# Patient Record
Sex: Female | Born: 1975 | Race: White | Hispanic: No | Smoking: Current every day smoker
Health system: Southern US, Community
[De-identification: ages and names within clinical notes are randomized; demographics above are authoritative.]

## PROBLEM LIST (undated history)

## (undated) DIAGNOSIS — K5792 Diverticulitis of intestine, part unspecified, without perforation or abscess without bleeding: Secondary | ICD-10-CM

## (undated) DIAGNOSIS — K429 Umbilical hernia without obstruction or gangrene: Secondary | ICD-10-CM

## (undated) DIAGNOSIS — I82409 Acute embolism and thrombosis of unspecified deep veins of unspecified lower extremity: Secondary | ICD-10-CM

## (undated) HISTORY — PX: TUBAL LIGATION: SHX77

## (undated) HISTORY — PX: UMBILICAL HERNIA REPAIR: SHX196

---

## 2016-04-23 ENCOUNTER — Emergency Department (HOSPITAL_COMMUNITY)
Admission: EM | Admit: 2016-04-23 | Discharge: 2016-04-24 | Disposition: A | Discharge status: Home / Self Care | Payer: Medicare Other | Attending: Emergency Medicine | Admitting: Emergency Medicine

## 2016-04-23 ENCOUNTER — Encounter (HOSPITAL_COMMUNITY): Payer: Self-pay | Admitting: *Deleted

## 2016-04-23 DIAGNOSIS — K529 Noninfective gastroenteritis and colitis, unspecified: Secondary | ICD-10-CM | POA: Diagnosis not present

## 2016-04-23 DIAGNOSIS — Z7951 Long term (current) use of inhaled steroids: Secondary | ICD-10-CM | POA: Diagnosis not present

## 2016-04-23 DIAGNOSIS — F17200 Nicotine dependence, unspecified, uncomplicated: Secondary | ICD-10-CM | POA: Diagnosis not present

## 2016-04-23 DIAGNOSIS — R109 Unspecified abdominal pain: Secondary | ICD-10-CM | POA: Diagnosis present

## 2016-04-23 HISTORY — DX: Diverticulitis of intestine, part unspecified, without perforation or abscess without bleeding: K57.92

## 2016-04-23 HISTORY — DX: Umbilical hernia without obstruction or gangrene: K42.9

## 2016-04-23 HISTORY — DX: Acute embolism and thrombosis of unspecified deep veins of unspecified lower extremity: I82.409

## 2016-04-23 LAB — URINALYSIS, ROUTINE W REFLEX MICROSCOPIC
Glucose, UA: NEGATIVE mg/dL
HGB URINE DIPSTICK: NEGATIVE
Ketones, ur: NEGATIVE mg/dL
Nitrite: NEGATIVE
PH: 6 (ref 5.0–8.0)
Protein, ur: NEGATIVE mg/dL
SPECIFIC GRAVITY, URINE: 1.022 (ref 1.005–1.030)

## 2016-04-23 LAB — LIPASE, BLOOD: Lipase: 22 U/L (ref 11–51)

## 2016-04-23 LAB — URINE MICROSCOPIC-ADD ON: RBC / HPF: NONE SEEN RBC/hpf (ref 0–5)

## 2016-04-23 LAB — COMPREHENSIVE METABOLIC PANEL
ALBUMIN: 3.8 g/dL (ref 3.5–5.0)
ALK PHOS: 86 U/L (ref 38–126)
ALT: 22 U/L (ref 14–54)
ANION GAP: 7 (ref 5–15)
AST: 22 U/L (ref 15–41)
BUN: 11 mg/dL (ref 6–20)
CHLORIDE: 101 mmol/L (ref 101–111)
CO2: 27 mmol/L (ref 22–32)
Calcium: 9.4 mg/dL (ref 8.9–10.3)
Creatinine, Ser: 0.82 mg/dL (ref 0.44–1.00)
GFR calc Af Amer: >60 mL/min (ref >60)
GFR calc non Af Amer: >60 mL/min (ref >60)
GLUCOSE: 98 mg/dL (ref 65–99)
POTASSIUM: 3.2 mmol/L — AB (ref 3.5–5.1)
Sodium: 135 mmol/L (ref 135–145)
Total Protein: 7.6 g/dL (ref 6.5–8.1)

## 2016-04-23 LAB — CBC
HEMATOCRIT: 31.7 % — AB (ref 36.0–46.0)
HEMOGLOBIN: 9.5 g/dL — AB (ref 12.0–15.0)
MCH: 20.6 pg — AB (ref 26.0–34.0)
MCHC: 30 g/dL (ref 30.0–36.0)
MCV: 68.6 fL — ABNORMAL LOW (ref 78.0–100.0)
Platelets: 414 10*3/uL — ABNORMAL HIGH (ref 150–400)
RBC: 4.62 MIL/uL (ref 3.87–5.11)
RDW: 18.6 % — ABNORMAL HIGH (ref 11.5–15.5)
WBC: 14.1 10*3/uL — ABNORMAL HIGH (ref 4.0–10.5)

## 2016-04-23 LAB — PREGNANCY, URINE: Preg Test, Ur: NEGATIVE

## 2016-04-23 MED ORDER — SODIUM CHLORIDE 0.9 % IV BOLUS (SEPSIS)
500.0000 mL | Freq: Once | INTRAVENOUS | Status: AC
  Administered 2016-04-23: 500 mL via INTRAVENOUS

## 2016-04-23 MED ORDER — HYDROMORPHONE HCL 1 MG/ML IJ SOLN
0.5000 mg | Freq: Once | INTRAMUSCULAR | Status: AC
  Administered 2016-04-23: 0.5 mg via INTRAVENOUS
  Filled 2016-04-23: qty 1

## 2016-04-23 NOTE — ED Notes (Signed)
Pt complains of mid abdominal pain, nausea, vomiting, dizziness, and diarrhea for the past 3 days. Pt states she has an umbilical hernia. Pt states she saw blood on tissue paper after a bowel movement today. Pt has hx of diverticulitis in November.

## 2016-04-24 ENCOUNTER — Emergency Department (HOSPITAL_COMMUNITY): Payer: Medicare Other

## 2016-04-24 ENCOUNTER — Encounter (HOSPITAL_COMMUNITY): Payer: Self-pay

## 2016-04-24 DIAGNOSIS — K529 Noninfective gastroenteritis and colitis, unspecified: Secondary | ICD-10-CM | POA: Diagnosis not present

## 2016-04-24 MED ORDER — OXYCODONE-ACETAMINOPHEN 5-325 MG PO TABS: 1.0000 | ORAL_TABLET | ORAL | Status: AC | PRN

## 2016-04-24 MED ORDER — METRONIDAZOLE 500 MG PO TABS
500.0000 mg | ORAL_TABLET | Freq: Three times a day (TID) | ORAL | Status: AC
Start: 2016-04-24 — End: ?

## 2016-04-24 MED ORDER — HYDROMORPHONE HCL 1 MG/ML IJ SOLN
1.0000 mg | Freq: Once | INTRAMUSCULAR | Status: AC
  Administered 2016-04-24: 1 mg via INTRAVENOUS
  Filled 2016-04-24: qty 1

## 2016-04-24 MED ORDER — METRONIDAZOLE 500 MG PO TABS
500.0000 mg | ORAL_TABLET | Freq: Once | ORAL | Status: AC
  Administered 2016-04-24: 500 mg via ORAL
  Filled 2016-04-24: qty 1

## 2016-04-24 MED ORDER — IOPAMIDOL (ISOVUE-300) INJECTION 61%
100.0000 mL | Freq: Once | INTRAVENOUS | Status: AC | PRN
  Administered 2016-04-24: 100 mL via INTRAVENOUS

## 2016-04-24 MED ORDER — PROMETHAZINE HCL 25 MG PO TABS: 25.0000 mg | ORAL_TABLET | Freq: Four times a day (QID) | ORAL | Status: AC | PRN

## 2016-04-24 MED ORDER — CIPROFLOXACIN HCL 500 MG PO TABS: 500.0000 mg | ORAL_TABLET | Freq: Two times a day (BID) | ORAL | Status: AC

## 2016-04-24 MED ORDER — CIPROFLOXACIN HCL 500 MG PO TABS
500.0000 mg | ORAL_TABLET | Freq: Once | ORAL | Status: AC
  Administered 2016-04-24: 500 mg via ORAL
  Filled 2016-04-24: qty 1

## 2016-04-24 NOTE — Discharge Instructions (Signed)
Return for return of fever, inability to eat and drink, inability to take your antibiotics, worsening or uncontrolled pain  Colitis Colitis is inflammation of the colon. Colitis may last a short time (acute) or it may last a long time (chronic). CAUSES This condition may be caused by:  Viruses.  Bacteria.  Reactions to medicine.  Certain autoimmune diseases, such as Crohn disease or ulcerative colitis. SYMPTOMS Symptoms of this condition include:  Diarrhea.  Passing bloody or tarry stool.  Pain.  Fever.  Vomiting.  Tiredness (fatigue).  Weight loss.  Bloating.  Sudden increase in abdominal pain.  Having fewer bowel movements than usual. DIAGNOSIS This condition is diagnosed with a stool test or a blood test. You may also have other tests, including X-rays, a CT scan, or a colonoscopy. TREATMENT Treatment may include:  Resting the bowel. This involves not eating or drinking for a period of time.  Fluids that are given through an IV tube.  Medicine for pain and diarrhea.  Antibiotic medicines.  Cortisone medicines.  Surgery. HOME CARE INSTRUCTIONS Eating and Drinking  Follow instructions from your health care provider about eating or drinking restrictions.  Drink enough fluid to keep your urine clear or pale yellow.  Work with a dietitian to determine which foods cause your condition to flare up.  Avoid foods that cause flare-ups.  Eat a well-balanced diet. Medicines  Take over-the-counter and prescription medicines only as told by your health care provider.  If you were prescribed an antibiotic medicine, take it as told by your health care provider. Do not stop taking the antibiotic even if you start to feel better. General Instructions  Keep all follow-up visits as told by your health care provider. This is important. SEEK MEDICAL CARE IF:  Your symptoms do not go away.  You develop new symptoms. SEEK IMMEDIATE MEDICAL CARE IF:  You have  a fever that does not go away with treatment.  You develop chills.  You have extreme weakness, fainting, or dehydration.  You have repeated vomiting.  You develop severe pain in your abdomen.  You pass bloody or tarry stool.   This information is not intended to replace advice given to you by your health care provider. Make sure you discuss any questions you have with your health care provider.   Document Released: 11/30/2004 Document Revised: 07/14/2015 Document Reviewed: 02/15/2015 Elsevier Interactive Patient Education [employer]2016 [employer] Inc.

## 2016-04-24 NOTE — ED Provider Notes (Signed)
CSN: [id number]     Arrival date & time 04/23/16  2027 History   First MD Initiated Contact with Patient 04/23/16 2230     Chief Complaint  Patient presents with  . Abdominal Pain  . Nausea  . Emesis  . Diarrhea     (Consider location/radiation/quality/duration/timing/severity/associated sxs/prior Treatment) HPI Comments: 40 year old female with history of diverticulitis, bowel obstruction, multiple abdominal surgeries presents for abdominal pain. The patient states that over the last 3 days she had a fever as well as nausea, vomiting, diarrhea. She reports that the fever broke and that the vomiting and diarrhea got better but that she has continued to have severe pain like somebody is taking a sharp knife and running in and out of her stomach all along the central part of her stomach.   Past Medical History  Diagnosis Date  . DVT (deep venous thrombosis) (HCC)   . Diverticulitis   . Umbilical hernia    Past Surgical History  Procedure Laterality Date  . Tubal ligation    . Umbilical hernia repair     No family history on file. Social History  Substance Use Topics  . Smoking status: Current Every Day Smoker  . Smokeless tobacco: None  . Alcohol Use: No   OB History    No data available     Review of Systems  Constitutional: Negative for fever, chills and fatigue.  HENT: Negative for congestion, postnasal drip, rhinorrhea and sinus pressure.   Eyes: Negative for visual disturbance.  Respiratory: Negative for cough, chest tightness and shortness of breath.   Cardiovascular: Negative for chest pain and palpitations.  Gastrointestinal: Positive for nausea, vomiting, abdominal pain and diarrhea.  Genitourinary: Negative for dysuria, urgency and hematuria.  Musculoskeletal: Negative for myalgias and back pain.  Skin: Negative for rash.  Neurological: Positive for light-headedness. Negative for dizziness, weakness and headaches.  Hematological: Does not bruise/bleed easily.       Allergies  Reglan  Home Medications   Prior to Admission medications   Medication Sig Start Date End Date Taking? Authorizing Provider  albuterol (PROVENTIL HFA;VENTOLIN HFA) 108 (90 Base) MCG/ACT inhaler Inhale 2 puffs into the lungs 2 (two) times daily. May use an additional 2 times daily as needed for shortness of breath   Yes Historical Provider, MD  albuterol (PROVENTIL) (2.5 MG/3ML) 0.083% nebulizer solution Take 2.5 mg by nebulization 4 (four) times daily.   Yes Historical Provider, MD  ciprofloxacin (CIPRO) 500 MG tablet Take 1 tablet (500 mg total) by mouth 2 (two) times daily. 04/24/16   Leta Baptist, MD  Fluticasone-Salmeterol (ADVAIR) 500-50 MCG/DOSE AEPB Inhale 1 puff into the lungs 2 (two) times daily.   Yes Historical Provider, MD  ipratropium-albuterol (DUONEB) 0.5-2.5 (3) MG/3ML SOLN Take 3 mLs by nebulization every 6 (six) hours as needed (shortness of breath).   Yes Historical Provider, MD  metroNIDAZOLE (FLAGYL) 500 MG tablet Take 1 tablet (500 mg total) by mouth 3 (three) times daily. 04/24/16   Leta Baptist, MD  omeprazole (PRILOSEC) 40 MG capsule Take 40 mg by mouth daily.   Yes Historical Provider, MD  oxyCODONE-acetaminophen (PERCOCET/ROXICET) 5-325 MG tablet Take 1-2 tablets by mouth every 4 (four) hours as needed for moderate pain. 04/24/16   Leta Baptist, MD  promethazine (PHENERGAN) 25 MG tablet Take 1 tablet (25 mg total) by mouth every 6 (six) hours as needed for nausea or vomiting. 04/24/16   Leta Baptist, MD   BP 123/78 mmHg  Pulse 89  Temp(Src) 98.1 F (36.7 C) (Oral)  Resp 14  Wt 250 lb (113.399 kg)  SpO2 96%  LMP 04/06/2016 Physical Exam  Constitutional: She is oriented to person, place, and time. She appears well-developed and well-nourished.  Appears uncomfortable  HENT:  Head: Normocephalic and atraumatic.  Right Ear: External ear normal.  Left Ear: External ear normal.  Nose: Nose normal.  Mouth/Throat: Oropharynx is clear  and moist. No oropharyngeal exudate.  Eyes: EOM are normal. Pupils are equal, round, and reactive to light.  Neck: Normal range of motion. Neck supple.  Cardiovascular: Normal rate, regular rhythm, normal heart sounds and intact distal pulses.   No murmur heard. Pulmonary/Chest: Effort normal. No respiratory distress. She has no wheezes. She has no rales.  Abdominal: Soft. She exhibits no distension. There is tenderness (diffuse but most significant over the central abdomen and periumbilical). There is no rebound and no guarding.  Musculoskeletal: Normal range of motion. She exhibits no edema or tenderness.  Neurological: She is alert and oriented to person, place, and time.  Skin: Skin is warm and dry. No rash noted. She is not diaphoretic.  Vitals reviewed.   ED Course  Procedures (including critical care time) Labs Review Labs Reviewed  COMPREHENSIVE METABOLIC PANEL - Abnormal; Notable for the following:    Potassium 3.2 (*)    Total Bilirubin <0.1 (*)    All other components within normal limits  CBC - Abnormal; Notable for the following:    WBC 14.1 (*)    Hemoglobin 9.5 (*)    HCT 31.7 (*)    MCV 68.6 (*)    MCH 20.6 (*)    RDW 18.6 (*)    Platelets 414 (*)    All other components within normal limits  URINALYSIS, ROUTINE W REFLEX MICROSCOPIC (NOT AT [facility]) - Abnormal; Notable for the following:    Color, Urine AMBER (*)    APPearance CLOUDY (*)    Bilirubin Urine SMALL (*)    Leukocytes, UA TRACE (*)    All other components within normal limits  URINE MICROSCOPIC-ADD ON - Abnormal; Notable for the following:    Squamous Epithelial / LPF 6-30 (*)    Bacteria, UA FEW (*)    Crystals CA OXALATE CRYSTALS (*)    All other components within normal limits  LIPASE, BLOOD  PREGNANCY, URINE    Imaging Review Ct Abdomen Pelvis W Contrast  04/24/2016  CLINICAL DATA:  Acute onset of left lower quadrant abdominal pain, nausea, vomiting and diarrhea. Leukocytosis. Initial  encounter. EXAM: CT ABDOMEN AND PELVIS WITH CONTRAST TECHNIQUE: Multidetector CT imaging of the abdomen and pelvis was performed using the standard protocol following bolus administration of intravenous contrast. CONTRAST:  ISOVUE-300 IOPAMIDOL (ISOVUE-300) INJECTION 61% COMPARISON:  None. FINDINGS: The visualized lung bases are clear.  A tiny hiatal hernia is noted. The liver is unremarkable in appearance. Scattered calcified granulomata are seen within the spleen. The gallbladder is within normal limits. The pancreas and adrenal glands are unremarkable. The kidneys are unremarkable in appearance. There is no evidence of hydronephrosis. No renal or ureteral stones are seen. No perinephric stranding is appreciated. No free fluid is identified. The small bowel is unremarkable in appearance. The stomach is within normal limits. No acute vascular abnormalities are seen. Minimal calcification is noted along the abdominal aorta and its branches, somewhat advanced for age. The appendix is normal in caliber, without evidence of appendicitis. Mild wall thickening is noted along the entirety of the colon, concerning for mild  acute infectious or inflammatory colitis. Mild scattered diverticulosis is noted along the descending colon, without evidence of diverticulitis. The bladder is mildly distended and grossly unremarkable. The uterus is unremarkable in appearance. The ovaries are relatively symmetric. No suspicious adnexal masses are seen. No inguinal lymphadenopathy is seen. No acute osseous abnormalities are identified. IMPRESSION: 1. Mild wall thickening along the entirety of the colon, concerning for mild acute infectious or inflammatory colitis. 2. Mild scattered diverticulosis along the descending colon, without evidence of diverticulitis. 3. Minimal calcification along the abdominal aorta and its branches, somewhat advanced for age. 4. Tiny hiatal hernia noted. Electronically Signed   By: Roanna RaiderJeffery  Chang M.D.    On: 04/24/2016 01:23   I have personally reviewed and evaluated these images and lab results as part of my medical decision-making.   EKG Interpretation None      MDM  Patient was seen and evaluated in stable condition. Patient with abdominal tenderness on examination. Labs with leukocytosis and mild hypokalemia. Patient was given IV fluids, antiemetics, pain medication. CT showed mild colitis. Results were discussed with patient. She reported feeling comfortable with plan for discharge. Patient was discharged home in stable condition with strict return precautions as well as prescriptions for Cipro and Flagyl as well as instruction to use a bland diet and follow-up outpatient. Final diagnoses:  Colitis    1. Colitis    Leta BaptistEmily Roe Christe Tellez, MD 04/24/16 [phone]

## 2016-04-24 NOTE — ED Notes (Signed)
Patient transported to CT 

## 2016-07-12 ENCOUNTER — Ambulatory Visit (HOSPITAL_COMMUNITY)
Admission: EM | Admit: 2016-07-12 | Discharge: 2016-07-12 | Disposition: A | Discharge status: Home / Self Care | Payer: Medicare Other | Attending: Internal Medicine | Admitting: Internal Medicine

## 2016-07-12 ENCOUNTER — Ambulatory Visit (INDEPENDENT_AMBULATORY_CARE_PROVIDER_SITE_OTHER): Payer: Medicare Other

## 2016-07-12 ENCOUNTER — Encounter (HOSPITAL_COMMUNITY): Payer: Self-pay | Admitting: Emergency Medicine

## 2016-07-12 DIAGNOSIS — M25512 Pain in left shoulder: Secondary | ICD-10-CM | POA: Diagnosis not present

## 2016-07-12 DIAGNOSIS — S46912A Strain of unspecified muscle, fascia and tendon at shoulder and upper arm level, left arm, initial encounter: Secondary | ICD-10-CM | POA: Diagnosis not present

## 2016-07-12 DIAGNOSIS — M6520 Calcific tendinitis, unspecified site: Secondary | ICD-10-CM

## 2016-07-12 MED ORDER — INDOMETHACIN 50 MG PO CAPS: 50.0000 mg | ORAL_CAPSULE | Freq: Two times a day (BID) | ORAL | 1 refills | Status: AC

## 2016-07-12 MED ORDER — HYDROCODONE-ACETAMINOPHEN 5-325 MG PO TABS: 2.0000 | ORAL_TABLET | ORAL | 0 refills | Status: AC | PRN

## 2016-07-12 NOTE — ED Triage Notes (Signed)
[facility] a [facility] left shoulder pain secondary to a fall down a flight of stairs yesterday. 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

## 2016-07-13 NOTE — ED Provider Notes (Signed)
CSN: [id number]     Arrival date & time 07/12/16  1839 History   First MD Initiated Contact with Patient 07/12/16 2011     Chief Complaint  Patient presents with  . Shoulder Pain   (Consider location/radiation/quality/duration/timing/severity/associated sxs/prior Treatment) HPI History obtained from patient:  Pt presents with the cc of:  Left shoulder pain Duration of symptoms: One day Treatment prior to arrival: Tylenol once Context: The patient states that she drove about 17 hours with her left arm in position for the extended to drive. She states that she now has pain in the left shoulder unable to really move it without excruciating pain. She has used one single Tylenol without any resolution of her symptoms. Other symptoms include: Pain with motion of the left shoulder Pain score: 6-10 FAMILY HISTORY: Hypertension    Past Medical History:  Diagnosis Date  . Diverticulitis   . DVT (deep venous thrombosis) (HCC)   . Umbilical hernia    Past Surgical History:  Procedure Laterality Date  . TUBAL LIGATION    . UMBILICAL HERNIA REPAIR     History reviewed. No pertinent family history. Social History  Substance Use Topics  . Smoking status: Current Every Day Smoker  . Smokeless tobacco: Not on file  . Alcohol use No   OB History    No data available     Review of Systems  Denies: HEADACHE, NAUSEA, ABDOMINAL PAIN, CHEST PAIN, CONGESTION, DYSURIA, SHORTNESS OF BREATH  Allergies  Reglan [metoclopramide]  Home Medications   Prior to Admission medications   Medication Sig Start Date End Date Taking? Authorizing Provider  albuterol (PROVENTIL HFA;VENTOLIN HFA) 108 (90 Base) MCG/ACT inhaler Inhale 2 puffs into the lungs 2 (two) times daily. May use an additional 2 times daily as needed for shortness of breath    Historical Provider, MD  albuterol (PROVENTIL) (2.5 MG/3ML) 0.083% nebulizer solution Take 2.5 mg by nebulization 4 (four) times daily.    Historical Provider, MD   ciprofloxacin (CIPRO) 500 MG tablet Take 1 tablet (500 mg total) by mouth 2 (two) times daily. 04/24/16   Leta BaptistEmily Roe Nguyen, MD  Fluticasone-Salmeterol (ADVAIR) 500-50 MCG/DOSE AEPB Inhale 1 puff into the lungs 2 (two) times daily.    Historical Provider, MD  HYDROcodone-acetaminophen (NORCO/VICODIN) 5-325 MG tablet Take 2 tablets by mouth every 4 (four) hours as needed. 07/12/16   Tharon AquasFrank C Patrick, PA  indomethacin (INDOCIN) 50 MG capsule Take 1 capsule (50 mg total) by mouth 2 (two) times daily with a meal. 07/12/16   Tharon AquasFrank C Patrick, PA  ipratropium-albuterol (DUONEB) 0.5-2.5 (3) MG/3ML SOLN Take 3 mLs by nebulization every 6 (six) hours as needed (shortness of breath).    Historical Provider, MD  metroNIDAZOLE (FLAGYL) 500 MG tablet Take 1 tablet (500 mg total) by mouth 3 (three) times daily. 04/24/16   Leta BaptistEmily Roe Nguyen, MD  omeprazole (PRILOSEC) 40 MG capsule Take 40 mg by mouth daily.    Historical Provider, MD  oxyCODONE-acetaminophen (PERCOCET/ROXICET) 5-325 MG tablet Take 1-2 tablets by mouth every 4 (four) hours as needed for moderate pain. 04/24/16   Leta BaptistEmily Roe Nguyen, MD  promethazine (PHENERGAN) 25 MG tablet Take 1 tablet (25 mg total) by mouth every 6 (six) hours as needed for nausea or vomiting. 04/24/16   Leta BaptistEmily Roe Nguyen, MD   Meds Ordered and Administered this Visit  Medications - No data to display  BP 148/94 (BP Location: Right Arm)   Pulse 76   Temp 98.7 F (37.1 C) (Oral)  Resp 20   LMP 06/28/2016 (Exact Date)   SpO2 100%  No data found.   Physical Exam NURSES NOTES AND VITAL SIGNS REVIEWED. CONSTITUTIONAL: Well developed, well nourished, no acute distress HEENT: normocephalic, atraumatic EYES: Conjunctiva normal NECK:normal ROM, supple, no adenopathy PULMONARY:No respiratory distress, normal effort ABDOMINAL: Soft, ND, NT BS+, No CVAT MUSCULOSKELETAL: Normal ROM of all extremities, left shoulder she has tenderness along the supraspinatus muscle, at the insertion of the  tendon. Range of motion is decreased. Motor and sensory function distally is intact. SKIN: warm and dry without rash PSYCHIATRIC: Mood and affect, behavior are normal  Urgent Care Course   Clinical Course    Procedures (including critical care time)  Labs Review Labs Reviewed - No data to display  Imaging Review Dg Shoulder Left  Result Date: 07/12/2016 CLINICAL DATA:  Fall on stairs yesterday, left shoulder pain. EXAM: LEFT SHOULDER - 2+ VIEW COMPARISON:  None. FINDINGS: Faint calcifications along the margin of the greater tuberosity of the humerus. No glenohumeral or AC joint malalignment. No fracture observed. IMPRESSION: 1. Potential mild calcific tendinopathy of the supraspinatus tendon distally. Otherwise, no significant abnormalities are observed. Electronically Signed   By: Gaylyn Rong M.D.   On: 07/12/2016 20:52   Discussed with patient prior to discharge.  Visual Acuity Review  Right Eye Distance:   Left Eye Distance:   Bilateral Distance:    Right Eye Near:   Left Eye Near:    Bilateral Near:         MDM   1. Left shoulder pain   2. Shoulder strain, left, initial encounter   3. Calcific tendinitis     Patient is reassured that there are no issues that require transfer to higher level of care at this time or additional tests. Patient is advised to continue home symptomatic treatment. Patient is advised that if there are new or worsening symptoms to attend the emergency department, contact primary care provider, or return to UC. Instructions of care provided discharged home in stable condition.    THIS NOTE WAS GENERATED USING A VOICE RECOGNITION SOFTWARE PROGRAM. ALL REASONABLE EFFORTS  WERE MADE TO PROOFREAD THIS DOCUMENT FOR ACCURACY.  I have verbally reviewed the discharge instructions with the patient. A printed AVS was given to the patient.  All questions were answered prior to discharge.      Tharon Aquas, PA 07/13/16 1246

## 2017-07-04 IMAGING — CT CT ABD-PELV W/ CM
2 of 4 series · 16 of 46 positions shown, 18 images · IV contrast (ISOVUE)
Comparison: None.

CLINICAL DATA: Acute onset of left lower quadrant abdominal pain,
nausea, vomiting and diarrhea. Leukocytosis. Initial encounter.

EXAM:
CT ABDOMEN AND PELVIS WITH CONTRAST
TECHNIQUE: Multidetector CT imaging of the abdomen and pelvis was performed
using the standard protocol following bolus administration of
intravenous contrast.
CONTRAST:  100mL YHHYB4-3AA IOPAMIDOL (YHHYB4-3AA) INJECTION 61%

[Series 2: abd/pel with · axial · 0.77mm/px · z∈[+1011,+1421]mm · 13 of 90 slices shown, 15 images]
[im 4/90  soft-tissue]
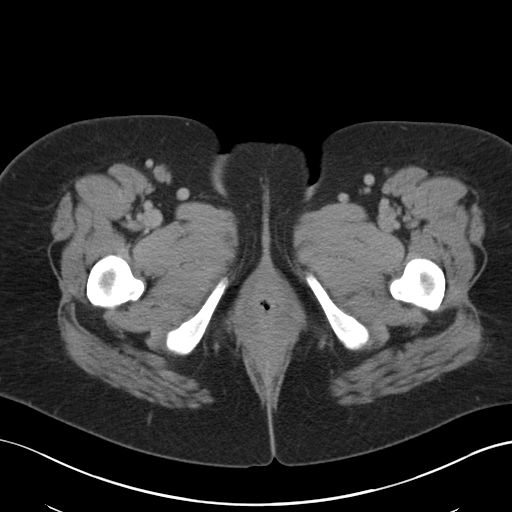
[im 4/90  bone]
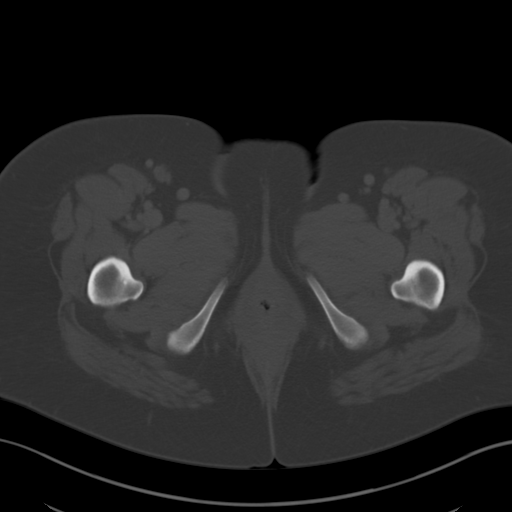
[im 12/90  soft-tissue]
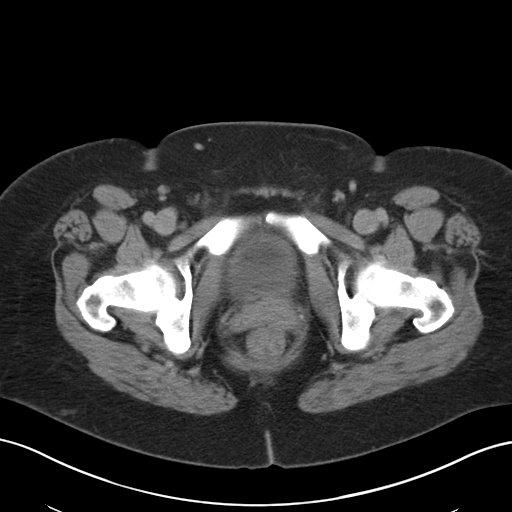
[im 20/90  soft-tissue]
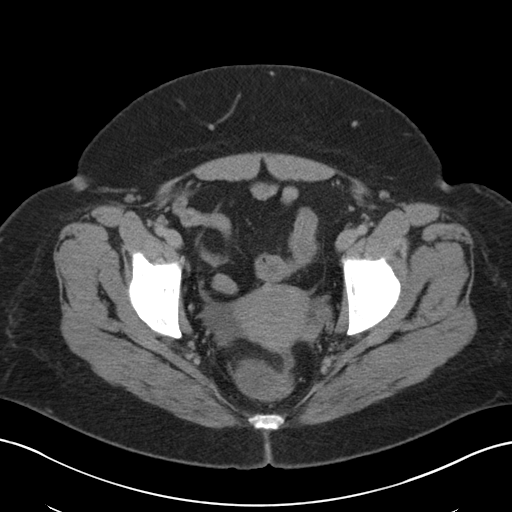
[im 24/90  soft-tissue]
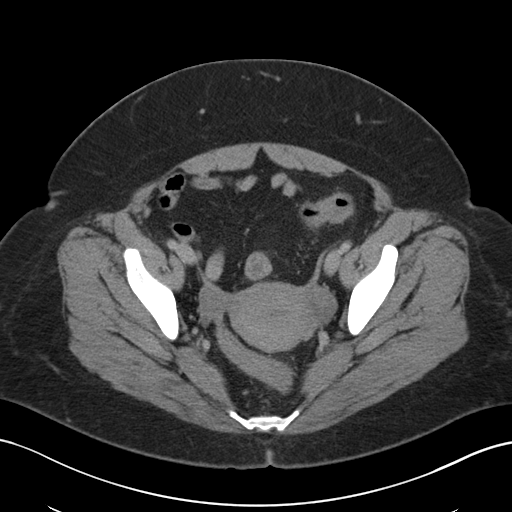
[im 31/90  soft-tissue]
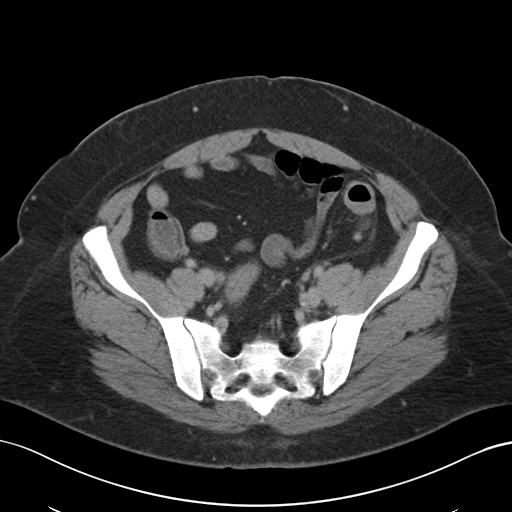
[im 39/90  soft-tissue]
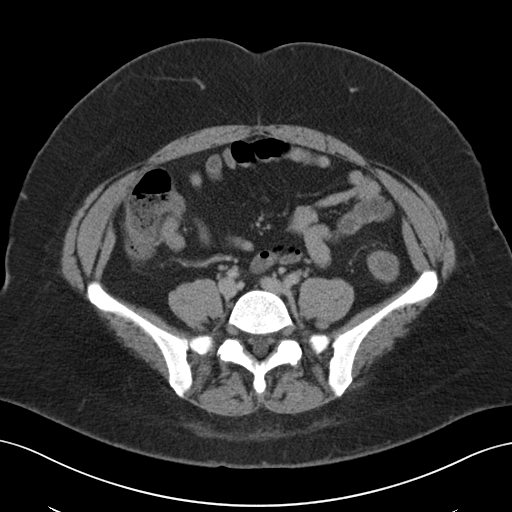
[im 47/90  soft-tissue]
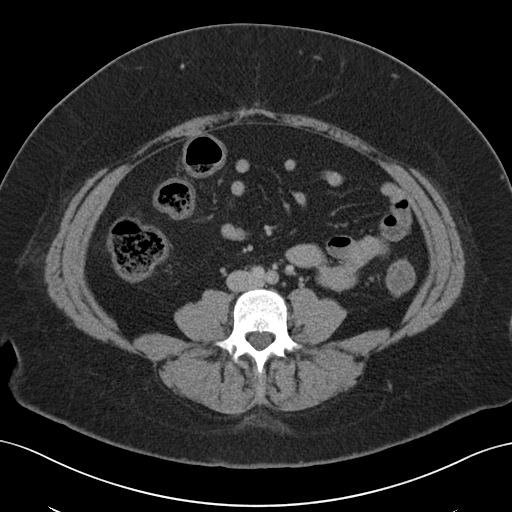
[im 51/90  soft-tissue]
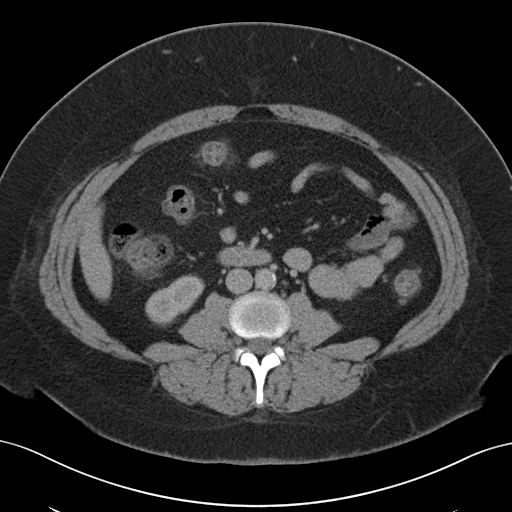
[im 59/90  soft-tissue]
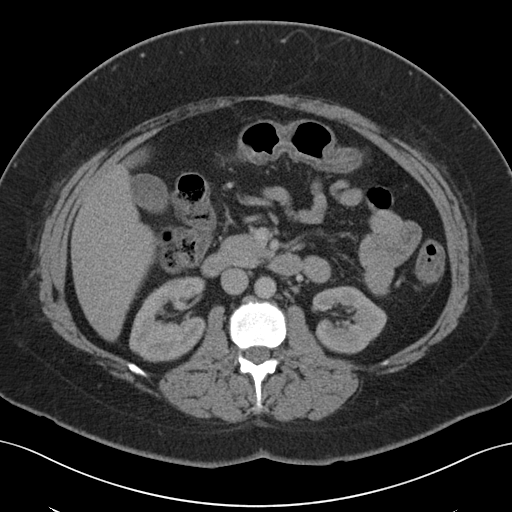
[im 59/90  bone]
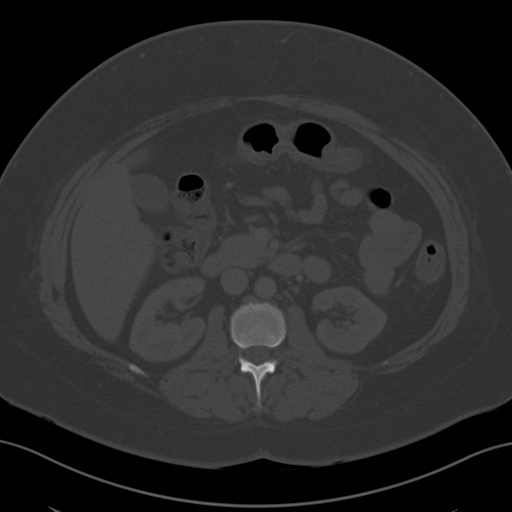
[im 66/90  soft-tissue]
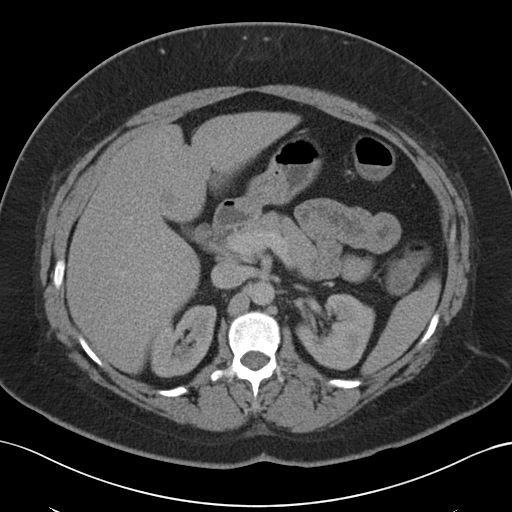
[im 70/90  soft-tissue]
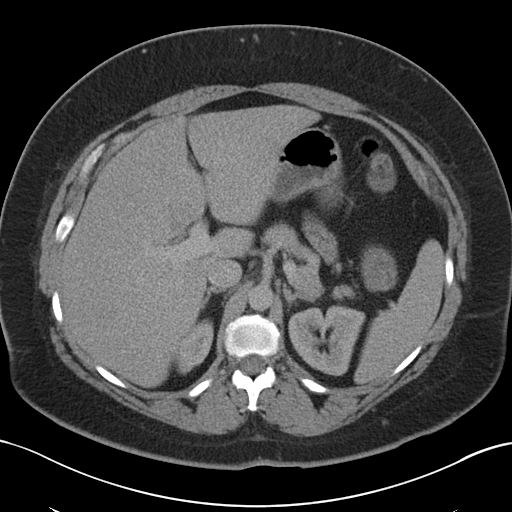
[im 78/90  soft-tissue]
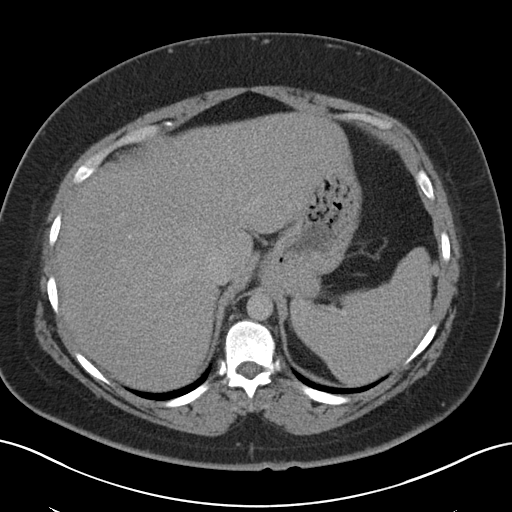
[im 86/90  soft-tissue]
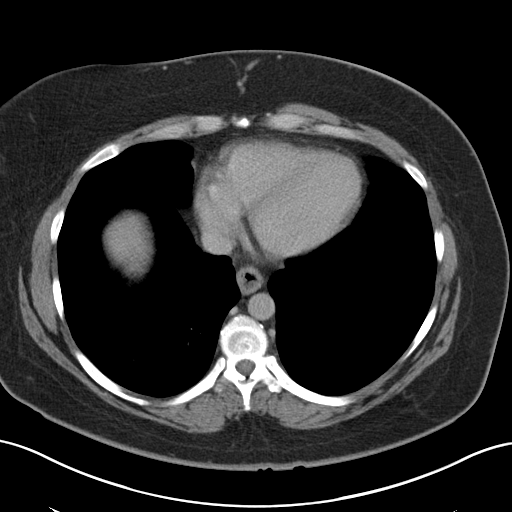

[Series 4: coronal a/|p · coronal · 0.69mm/px · 3 of 149 slices shown]
[im 50/149  soft-tissue]
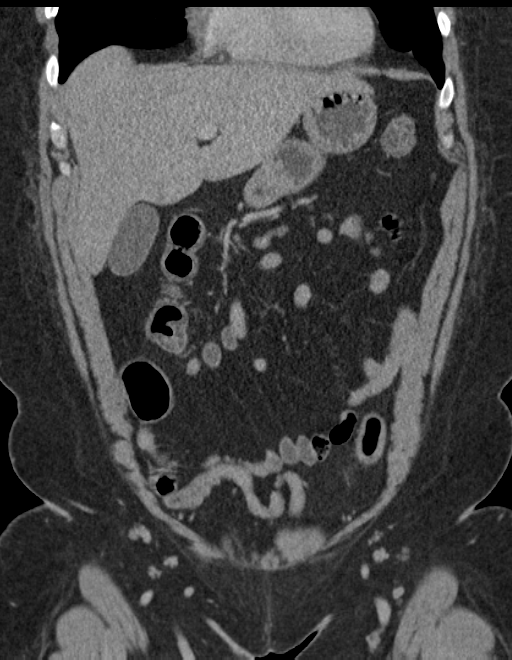
[im 66/149  soft-tissue]
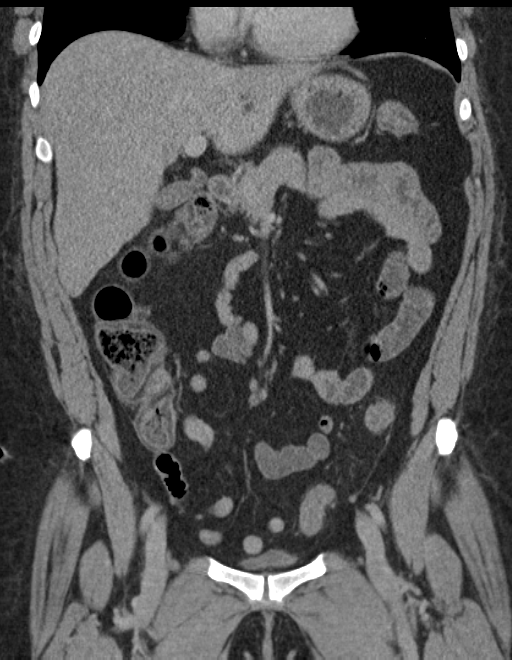
[im 83/149  soft-tissue]
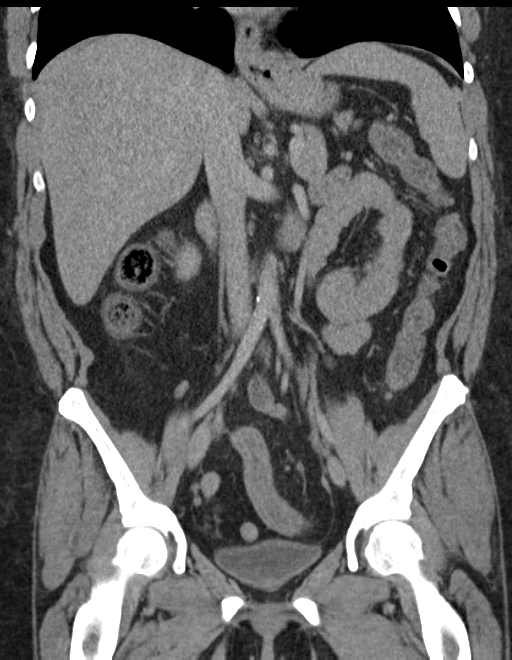

[16 of 46 positions shown; findings below may reference images not displayed]

FINDINGS: The visualized lung bases are clear.  A tiny hiatal hernia is noted.

The liver is unremarkable in appearance. Scattered calcified
granulomata are seen within the spleen. The gallbladder is within
normal limits. The pancreas and adrenal glands are unremarkable.

The kidneys are unremarkable in appearance. There is no evidence of
hydronephrosis. No renal or ureteral stones are seen. No perinephric
stranding is appreciated.

No free fluid is identified. The small bowel is unremarkable in
appearance. The stomach is within normal limits. No acute vascular
abnormalities are seen. Minimal calcification is noted along the
abdominal aorta and its branches, somewhat advanced for age.

The appendix is normal in caliber, without evidence of appendicitis.
Mild wall thickening is noted along the entirety of the colon,
concerning for mild acute infectious or inflammatory colitis. Mild
scattered diverticulosis is noted along the descending colon,
without evidence of diverticulitis.

The bladder is mildly distended and grossly unremarkable. The uterus
is unremarkable in appearance. The ovaries are relatively symmetric.
No suspicious adnexal masses are seen. No inguinal lymphadenopathy
is seen.

No acute osseous abnormalities are identified.
IMPRESSION: 1. Mild wall thickening along the entirety of the colon, concerning
for mild acute infectious or inflammatory colitis.
2. Mild scattered diverticulosis along the descending colon, without
evidence of diverticulitis.
3. Minimal calcification along the abdominal aorta and its branches,
somewhat advanced for age.
4. Tiny hiatal hernia noted.
